# Patient Record
Sex: Male | Born: 1995 | Race: White | Hispanic: No | Marital: Single | Smoking: Never smoker
Health system: Southern US, Community
[De-identification: ages and names within clinical notes are randomized; demographics above are authoritative.]

## PROBLEM LIST (undated history)

## (undated) HISTORY — PX: OTHER SURGICAL HISTORY: SHX169

## (undated) HISTORY — PX: KIDNEY SURGERY: SHX687

## (undated) HISTORY — PX: ANKLE SURGERY: SHX546

---
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
## 2013-06-18 ENCOUNTER — Encounter (INDEPENDENT_AMBULATORY_CARE_PROVIDER_SITE_OTHER): Payer: Self-pay | Admitting: General Surgery

## 2013-06-18 ENCOUNTER — Ambulatory Visit (INDEPENDENT_AMBULATORY_CARE_PROVIDER_SITE_OTHER): Payer: Managed Care, Other (non HMO) | Admitting: General Surgery

## 2013-06-18 VITALS — BP 124/78 | HR 84 | Temp 98.3°F | Resp 16 | Ht 75.0 in | Wt 201.0 lb

## 2013-06-18 DIAGNOSIS — K612 Anorectal abscess: Secondary | ICD-10-CM | POA: Insufficient documentation

## 2013-06-18 NOTE — Progress Notes (Signed)
Patient ID: Samuel Weaver, male   DOB: [birth year], 18 y.o.   MRN: [id number]  Chief Complaint  Patient presents with  . Follow-up    pilonidal cyst    HPI Samuel Weaver is a 18 y.o. male.   HPI  He is referred by Dr. Laurann Montanaynthia White for further evaluation of a pilonidal abscess. He noted some pain in his inferior gluteal cleft area about 9 days ago. He then began having spontaneous drainage from the site. He saw Dr. Ihor DowNnodi.  The wound was drained and he was started on oral antibiotics. He has been feeling much better since that time. He has never had anything like this before.  History reviewed. No pertinent past medical history.  Past Surgical History  Procedure Laterality Date  . Kidney surgery  1996    History reviewed. No pertinent family history.  Social History History  Substance Use Topics  . Smoking status: Never Smoker   . Smokeless tobacco: Never Used  . Alcohol Use: No    No Known Allergies  No current outpatient prescriptions on file.   No current facility-administered medications for this visit.    Review of Systems Review of Systems  All other systems reviewed and are negative.   Blood pressure 124/78, pulse 84, temperature 98.3 F (36.8 C), temperature source Temporal, resp. rate 16, height 6\' 3"  (1.905 m), weight 201 lb (91.173 kg).  Physical Exam Physical Exam  Constitutional: He appears well-developed and well-nourished. No distress.  Musculoskeletal:  Very tiny punctate wound just posterior to the anus. The gluteal cleft appears clean. No induration or cellulitis.  Neurological: He is alert.  Skin: Skin is warm.    Data Reviewed Note from Dr. Cliffton AstersWhite.  Assessment    Posterior anal rectal abscess versus pilonidal abscess. Almost completely resolved.     Plan    Clean the area with warm water daily for the next 4-5 days. Complete antibiotic course. Return if the infection comes back.        Jim Desanctisodd J Skyanne Welle 06/18/2013, 3:48  PM

## 2013-06-18 NOTE — Patient Instructions (Signed)
Clean area in the shower with warm water for the next 4-5 days. If the infection comes back, please call us and make an appointment. Take antibiotics until they are gone.

## 2015-09-08 ENCOUNTER — Emergency Department (HOSPITAL_COMMUNITY): Payer: Managed Care, Other (non HMO)

## 2015-09-08 ENCOUNTER — Encounter (HOSPITAL_COMMUNITY): Payer: Self-pay

## 2015-09-08 ENCOUNTER — Emergency Department (HOSPITAL_COMMUNITY)
Admission: EM | Admit: 2015-09-08 | Discharge: 2015-09-09 | Payer: Managed Care, Other (non HMO) | Attending: Emergency Medicine | Admitting: Emergency Medicine

## 2015-09-08 DIAGNOSIS — S32411A Displaced fracture of anterior wall of right acetabulum, initial encounter for closed fracture: Secondary | ICD-10-CM | POA: Diagnosis not present

## 2015-09-08 DIAGNOSIS — S42301A Unspecified fracture of shaft of humerus, right arm, initial encounter for closed fracture: Secondary | ICD-10-CM | POA: Insufficient documentation

## 2015-09-08 DIAGNOSIS — Y999 Unspecified external cause status: Secondary | ICD-10-CM | POA: Insufficient documentation

## 2015-09-08 DIAGNOSIS — S92141A Displaced dome fracture of right talus, initial encounter for closed fracture: Secondary | ICD-10-CM | POA: Diagnosis not present

## 2015-09-08 DIAGNOSIS — S72301A Unspecified fracture of shaft of right femur, initial encounter for closed fracture: Secondary | ICD-10-CM | POA: Diagnosis not present

## 2015-09-08 DIAGNOSIS — S0990XA Unspecified injury of head, initial encounter: Secondary | ICD-10-CM | POA: Diagnosis not present

## 2015-09-08 DIAGNOSIS — S299XXA Unspecified injury of thorax, initial encounter: Secondary | ICD-10-CM | POA: Diagnosis not present

## 2015-09-08 DIAGNOSIS — S7291XA Unspecified fracture of right femur, initial encounter for closed fracture: Secondary | ICD-10-CM

## 2015-09-08 DIAGNOSIS — S92101A Unspecified fracture of right talus, initial encounter for closed fracture: Secondary | ICD-10-CM

## 2015-09-08 DIAGNOSIS — Y92410 Unspecified street and highway as the place of occurrence of the external cause: Secondary | ICD-10-CM | POA: Diagnosis not present

## 2015-09-08 DIAGNOSIS — T1490XA Injury, unspecified, initial encounter: Secondary | ICD-10-CM

## 2015-09-08 DIAGNOSIS — Y939 Activity, unspecified: Secondary | ICD-10-CM | POA: Diagnosis not present

## 2015-09-08 DIAGNOSIS — S4991XA Unspecified injury of right shoulder and upper arm, initial encounter: Secondary | ICD-10-CM | POA: Diagnosis present

## 2015-09-08 LAB — I-STAT CHEM 8, ED
BUN: 18 mg/dL (ref 6–20)
CHLORIDE: 105 mmol/L (ref 101–111)
CREATININE: 1 mg/dL (ref 0.61–1.24)
Calcium, Ion: 1 mmol/L — ABNORMAL LOW (ref 1.13–1.30)
GLUCOSE: 120 mg/dL — AB (ref 65–99)
HEMATOCRIT: 47 % (ref 39.0–52.0)
Hemoglobin: 16 g/dL (ref 13.0–17.0)
POTASSIUM: 3.7 mmol/L (ref 3.5–5.1)
Sodium: 140 mmol/L (ref 135–145)
TCO2: 21 mmol/L (ref 0–100)

## 2015-09-08 LAB — COMPREHENSIVE METABOLIC PANEL
ALK PHOS: 97 U/L (ref 38–126)
ALT: 29 U/L (ref 17–63)
AST: 42 U/L — AB (ref 15–41)
Albumin: 3.8 g/dL (ref 3.5–5.0)
Anion gap: 10 (ref 5–15)
BILIRUBIN TOTAL: 0.8 mg/dL (ref 0.3–1.2)
BUN: 15 mg/dL (ref 6–20)
CHLORIDE: 107 mmol/L (ref 101–111)
CO2: 22 mmol/L (ref 22–32)
CREATININE: 1 mg/dL (ref 0.61–1.24)
Calcium: 8.8 mg/dL — ABNORMAL LOW (ref 8.9–10.3)
Glucose, Bld: 117 mg/dL — ABNORMAL HIGH (ref 65–99)
Potassium: 3.7 mmol/L (ref 3.5–5.1)
Sodium: 139 mmol/L (ref 135–145)
Total Protein: 6.8 g/dL (ref 6.5–8.1)

## 2015-09-08 LAB — I-STAT CG4 LACTIC ACID, ED: Lactic Acid, Venous: 2.89 mmol/L (ref 0.5–1.9)

## 2015-09-08 LAB — CBC
HEMATOCRIT: 46.8 % (ref 39.0–52.0)
Hemoglobin: 16.1 g/dL (ref 13.0–17.0)
MCH: 29.3 pg (ref 26.0–34.0)
MCHC: 34.4 g/dL (ref 30.0–36.0)
MCV: 85.1 fL (ref 78.0–100.0)
Platelets: 234 10*3/uL (ref 150–400)
RBC: 5.5 MIL/uL (ref 4.22–5.81)
RDW: 13 % (ref 11.5–15.5)
WBC: 12.8 10*3/uL — AB (ref 4.0–10.5)

## 2015-09-08 LAB — SAMPLE TO BLOOD BANK

## 2015-09-08 LAB — ETHANOL: Alcohol, Ethyl (B): <5 mg/dL (ref <5)

## 2015-09-08 LAB — PROTIME-INR
INR: 1.22 (ref 0.00–1.49)
PROTHROMBIN TIME: 15.5 s — AB (ref 11.6–15.2)

## 2015-09-08 LAB — CDS SEROLOGY

## 2015-09-08 MED ORDER — IOPAMIDOL (ISOVUE-300) INJECTION 61%
INTRAVENOUS | Status: AC
  Administered 2015-09-08: 100 mL
  Filled 2015-09-08: qty 100

## 2015-09-08 MED ORDER — HYDROMORPHONE HCL 1 MG/ML IJ SOLN
1.0000 mg | Freq: Once | INTRAMUSCULAR | Status: AC
  Administered 2015-09-08: 1 mg via INTRAVENOUS
  Filled 2015-09-08: qty 1

## 2015-09-08 MED ORDER — HYDROMORPHONE HCL 1 MG/ML IJ SOLN
INTRAMUSCULAR | Status: AC | PRN
  Administered 2015-09-08: 1 mg via INTRAVENOUS

## 2015-09-08 MED ORDER — SODIUM CHLORIDE 0.9 % IV BOLUS (SEPSIS)
1000.0000 mL | Freq: Once | INTRAVENOUS | Status: AC
  Administered 2015-09-08: 1000 mL via INTRAVENOUS

## 2015-09-08 MED ORDER — HYDROMORPHONE HCL 1 MG/ML IJ SOLN
INTRAMUSCULAR | Status: AC
  Filled 2015-09-08: qty 1

## 2015-09-08 MED ORDER — ONDANSETRON HCL 4 MG/2ML IJ SOLN
INTRAMUSCULAR | Status: AC
  Administered 2015-09-08: 21:00:00
  Filled 2015-09-08: qty 2
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
## 2015-09-08 NOTE — ED Notes (Signed)
Pts father Sharlot Gowda(Gregg) called and informed that patient is here per pts request

## 2015-09-08 NOTE — Progress Notes (Signed)
Orthopedic Tech Progress Note Patient Details:  Samuel Weaver Sep 05, 1995 [id number]  Ortho Devices Type of Ortho Device: Ace wrap, Knee Immobilizer, Stirrup splint, Coapt Ortho Device/Splint Location: ki RLE, coapt RUE Ortho Device/Splint Interventions: Ordered, Application   Jennye MoccasinHughes, Sascha Palma Craig 09/08/2015, 9:46 PM

## 2015-09-08 NOTE — ED Notes (Signed)
Family at beside. Family given emotional support. 

## 2015-09-08 NOTE — ED Notes (Signed)
Pt placed on 2 L o2.

## 2015-09-08 NOTE — ED Notes (Addendum)
Contacted radiology at request of Dr. Preston FleetingGlick to prepare pt's xrays to move to [facility]

## 2015-09-09 ENCOUNTER — Encounter (INDEPENDENT_AMBULATORY_CARE_PROVIDER_SITE_OTHER): Payer: Self-pay | Admitting: General Surgery

## 2015-09-10 MED FILL — Hydromorphone HCl Inj 2 MG/ML: INTRAMUSCULAR | Qty: 1 | Status: AC

## 2015-10-11 ENCOUNTER — Encounter (HOSPITAL_BASED_OUTPATIENT_CLINIC_OR_DEPARTMENT_OTHER): Payer: Self-pay | Admitting: Emergency Medicine

## 2015-10-11 ENCOUNTER — Emergency Department (HOSPITAL_BASED_OUTPATIENT_CLINIC_OR_DEPARTMENT_OTHER)
Admission: EM | Admit: 2015-10-11 | Discharge: 2015-10-12 | Disposition: A | Discharge status: Home / Self Care | Payer: Managed Care, Other (non HMO) | Attending: Emergency Medicine | Admitting: Emergency Medicine

## 2015-10-11 ENCOUNTER — Emergency Department (HOSPITAL_BASED_OUTPATIENT_CLINIC_OR_DEPARTMENT_OTHER): Payer: Managed Care, Other (non HMO)

## 2015-10-11 DIAGNOSIS — Z79899 Other long term (current) drug therapy: Secondary | ICD-10-CM | POA: Insufficient documentation

## 2015-10-11 DIAGNOSIS — N201 Calculus of ureter: Secondary | ICD-10-CM | POA: Diagnosis not present

## 2015-10-11 DIAGNOSIS — R109 Unspecified abdominal pain: Secondary | ICD-10-CM | POA: Diagnosis present

## 2015-10-11 LAB — CBC WITH DIFFERENTIAL/PLATELET
BASOS ABS: 0 10*3/uL (ref 0.0–0.1)
BASOS PCT: 1 %
EOS ABS: 0.2 10*3/uL (ref 0.0–0.7)
EOS PCT: 3 %
HEMATOCRIT: 35.9 % — AB (ref 39.0–52.0)
Hemoglobin: 11.3 g/dL — ABNORMAL LOW (ref 13.0–17.0)
Lymphocytes Relative: 26 %
Lymphs Abs: 1.7 10*3/uL (ref 0.7–4.0)
MCH: 26.4 pg (ref 26.0–34.0)
MCHC: 31.5 g/dL (ref 30.0–36.0)
MCV: 83.9 fL (ref 78.0–100.0)
MONO ABS: 0.7 10*3/uL (ref 0.1–1.0)
MONOS PCT: 10 %
Neutro Abs: 4 10*3/uL (ref 1.7–7.7)
Neutrophils Relative %: 60 %
PLATELETS: 259 10*3/uL (ref 150–400)
RBC: 4.28 MIL/uL (ref 4.22–5.81)
RDW: 14.2 % (ref 11.5–15.5)
WBC: 6.7 10*3/uL (ref 4.0–10.5)

## 2015-10-11 LAB — BASIC METABOLIC PANEL
Anion gap: 9 (ref 5–15)
BUN: 16 mg/dL (ref 6–20)
CALCIUM: 8.7 mg/dL — AB (ref 8.9–10.3)
CO2: 27 mmol/L (ref 22–32)
CREATININE: 0.98 mg/dL (ref 0.61–1.24)
Chloride: 101 mmol/L (ref 101–111)
Glucose, Bld: 105 mg/dL — ABNORMAL HIGH (ref 65–99)
Potassium: 3.6 mmol/L (ref 3.5–5.1)
SODIUM: 137 mmol/L (ref 135–145)

## 2015-10-11 LAB — URINALYSIS, ROUTINE W REFLEX MICROSCOPIC
Bilirubin Urine: NEGATIVE
GLUCOSE, UA: NEGATIVE mg/dL
Hgb urine dipstick: NEGATIVE
Ketones, ur: NEGATIVE mg/dL
LEUKOCYTES UA: NEGATIVE
NITRITE: NEGATIVE
PROTEIN: NEGATIVE mg/dL
Specific Gravity, Urine: 1.024 (ref 1.005–1.030)
pH: 6.5 (ref 5.0–8.0)

## 2015-10-11 MED ORDER — TAMSULOSIN HCL 0.4 MG PO CAPS: 0.4000 mg | ORAL_CAPSULE | Freq: Every day | ORAL | 0 refills | Status: AC

## 2015-10-11 MED ORDER — HYDROMORPHONE HCL 1 MG/ML IJ SOLN
0.5000 mg | Freq: Once | INTRAMUSCULAR | Status: AC
  Administered 2015-10-11: 0.5 mg via INTRAVENOUS
  Filled 2015-10-11: qty 1

## 2015-10-11 MED ORDER — ONDANSETRON HCL 4 MG/2ML IJ SOLN
4.0000 mg | Freq: Once | INTRAMUSCULAR | Status: AC
  Administered 2015-10-11: 4 mg via INTRAVENOUS

## 2015-10-11 MED ORDER — SODIUM CHLORIDE 0.9 % IV BOLUS (SEPSIS)
1000.0000 mL | Freq: Once | INTRAVENOUS | Status: AC
  Administered 2015-10-11: 1000 mL via INTRAVENOUS

## 2015-10-11 MED ORDER — ONDANSETRON HCL 4 MG/2ML IJ SOLN
INTRAMUSCULAR | Status: AC
  Administered 2015-10-11: 4 mg via INTRAVENOUS
  Filled 2015-10-11: qty 2

## 2015-10-11 MED ORDER — HYDROMORPHONE HCL 1 MG/ML IJ SOLN
1.0000 mg | Freq: Once | INTRAMUSCULAR | Status: AC
  Administered 2015-10-11: 1 mg via INTRAVENOUS
  Filled 2015-10-11: qty 1

## 2015-10-11 MED ORDER — ONDANSETRON HCL 4 MG PO TABS: 4.0000 mg | ORAL_TABLET | Freq: Four times a day (QID) | ORAL | 0 refills | Status: AC | PRN

## 2015-10-11 MED ORDER — ONDANSETRON HCL 4 MG/2ML IJ SOLN
4.0000 mg | Freq: Once | INTRAMUSCULAR | Status: AC
Start: 2015-10-11 — End: 2015-10-11
  Administered 2015-10-11: 4 mg via INTRAVENOUS
  Filled 2015-10-11: qty 2

## 2015-10-11 NOTE — Discharge Instructions (Signed)
You may have small stone on the left side. Continue pain medications, flomax, and nausea medications as needed. You are given urology follow-up. Please see PCP for further pain management as well.  Return for worsening symptoms, including intractable vomiting, fever, escalating pain, or any other symptoms concerning to you.

## 2015-10-11 NOTE — ED Notes (Signed)
Pt attempted to give urine sample without success.
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
## 2015-10-12 NOTE — ED Notes (Signed)
Denies questions or needs, VSS, given Rx x2, out in personal w/c with parents and RN

## 2015-10-19 ENCOUNTER — Encounter: Payer: Self-pay | Admitting: Physical Therapy

## 2015-10-19 ENCOUNTER — Ambulatory Visit: Payer: Managed Care, Other (non HMO) | Attending: Physical Medicine and Rehabilitation | Admitting: Physical Therapy

## 2015-10-19 DIAGNOSIS — M25671 Stiffness of right ankle, not elsewhere classified: Secondary | ICD-10-CM | POA: Insufficient documentation

## 2015-10-19 DIAGNOSIS — M25571 Pain in right ankle and joints of right foot: Secondary | ICD-10-CM | POA: Diagnosis present

## 2015-10-19 DIAGNOSIS — M6281 Muscle weakness (generalized): Secondary | ICD-10-CM | POA: Diagnosis present

## 2015-10-19 DIAGNOSIS — R262 Difficulty in walking, not elsewhere classified: Secondary | ICD-10-CM | POA: Diagnosis present
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
## 2015-10-20 ENCOUNTER — Ambulatory Visit: Payer: Managed Care, Other (non HMO) | Admitting: Physical Therapy

## 2015-10-20 ENCOUNTER — Encounter: Payer: Self-pay | Admitting: Physical Therapy

## 2015-10-20 DIAGNOSIS — R262 Difficulty in walking, not elsewhere classified: Secondary | ICD-10-CM | POA: Diagnosis not present

## 2015-10-20 DIAGNOSIS — M25671 Stiffness of right ankle, not elsewhere classified: Secondary | ICD-10-CM

## 2015-10-20 DIAGNOSIS — M6281 Muscle weakness (generalized): Secondary | ICD-10-CM

## 2015-10-20 DIAGNOSIS — M25571 Pain in right ankle and joints of right foot: Secondary | ICD-10-CM
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
## 2015-10-23 ENCOUNTER — Ambulatory Visit: Payer: Managed Care, Other (non HMO) | Admitting: Physical Therapy

## 2015-10-23 ENCOUNTER — Encounter: Payer: Self-pay | Admitting: Physical Therapy

## 2015-10-23 DIAGNOSIS — R262 Difficulty in walking, not elsewhere classified: Secondary | ICD-10-CM

## 2015-10-23 DIAGNOSIS — M25671 Stiffness of right ankle, not elsewhere classified: Secondary | ICD-10-CM

## 2015-10-23 DIAGNOSIS — M25571 Pain in right ankle and joints of right foot: Secondary | ICD-10-CM

## 2015-10-23 DIAGNOSIS — M6281 Muscle weakness (generalized): Secondary | ICD-10-CM
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
## 2015-10-26 ENCOUNTER — Encounter: Payer: Self-pay | Admitting: Physical Therapy

## 2015-10-26 ENCOUNTER — Ambulatory Visit: Payer: Managed Care, Other (non HMO) | Admitting: Physical Therapy

## 2015-10-26 DIAGNOSIS — M6281 Muscle weakness (generalized): Secondary | ICD-10-CM

## 2015-10-26 DIAGNOSIS — R262 Difficulty in walking, not elsewhere classified: Secondary | ICD-10-CM | POA: Diagnosis not present

## 2015-10-26 DIAGNOSIS — M25671 Stiffness of right ankle, not elsewhere classified: Secondary | ICD-10-CM

## 2015-10-26 DIAGNOSIS — M25571 Pain in right ankle and joints of right foot: Secondary | ICD-10-CM
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
## 2015-10-28 ENCOUNTER — Encounter: Payer: Self-pay | Admitting: Physical Therapy

## 2015-10-28 ENCOUNTER — Ambulatory Visit: Payer: Managed Care, Other (non HMO) | Admitting: Physical Therapy

## 2015-10-28 DIAGNOSIS — M25571 Pain in right ankle and joints of right foot: Secondary | ICD-10-CM

## 2015-10-28 DIAGNOSIS — R262 Difficulty in walking, not elsewhere classified: Secondary | ICD-10-CM | POA: Diagnosis not present

## 2015-10-28 DIAGNOSIS — M25671 Stiffness of right ankle, not elsewhere classified: Secondary | ICD-10-CM

## 2015-10-28 DIAGNOSIS — M6281 Muscle weakness (generalized): Secondary | ICD-10-CM
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
## 2015-10-30 ENCOUNTER — Encounter: Payer: Self-pay | Admitting: Physical Therapy

## 2015-10-30 ENCOUNTER — Ambulatory Visit: Payer: Managed Care, Other (non HMO) | Admitting: Physical Therapy

## 2015-10-30 DIAGNOSIS — M25671 Stiffness of right ankle, not elsewhere classified: Secondary | ICD-10-CM

## 2015-10-30 DIAGNOSIS — M6281 Muscle weakness (generalized): Secondary | ICD-10-CM

## 2015-10-30 DIAGNOSIS — R262 Difficulty in walking, not elsewhere classified: Secondary | ICD-10-CM | POA: Diagnosis not present

## 2015-10-30 DIAGNOSIS — M25571 Pain in right ankle and joints of right foot: Secondary | ICD-10-CM
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
## 2015-11-02 ENCOUNTER — Ambulatory Visit: Payer: Managed Care, Other (non HMO) | Admitting: Physical Therapy

## 2015-11-02 ENCOUNTER — Encounter: Payer: Self-pay | Admitting: Physical Therapy

## 2015-11-02 DIAGNOSIS — M25571 Pain in right ankle and joints of right foot: Secondary | ICD-10-CM

## 2015-11-02 DIAGNOSIS — M25671 Stiffness of right ankle, not elsewhere classified: Secondary | ICD-10-CM

## 2015-11-02 DIAGNOSIS — R262 Difficulty in walking, not elsewhere classified: Secondary | ICD-10-CM

## 2015-11-02 DIAGNOSIS — M6281 Muscle weakness (generalized): Secondary | ICD-10-CM
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
## 2015-11-04 ENCOUNTER — Ambulatory Visit: Payer: Managed Care, Other (non HMO) | Admitting: Physical Therapy

## 2015-11-04 ENCOUNTER — Encounter: Payer: Self-pay | Admitting: Physical Therapy

## 2015-11-04 DIAGNOSIS — R262 Difficulty in walking, not elsewhere classified: Secondary | ICD-10-CM | POA: Diagnosis not present

## 2015-11-04 DIAGNOSIS — M6281 Muscle weakness (generalized): Secondary | ICD-10-CM

## 2015-11-04 DIAGNOSIS — M25671 Stiffness of right ankle, not elsewhere classified: Secondary | ICD-10-CM

## 2015-11-04 DIAGNOSIS — M25571 Pain in right ankle and joints of right foot: Secondary | ICD-10-CM
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
## 2015-11-11 ENCOUNTER — Ambulatory Visit: Payer: Managed Care, Other (non HMO) | Attending: Physical Medicine and Rehabilitation | Admitting: Physical Therapy

## 2015-11-11 ENCOUNTER — Encounter: Payer: Self-pay | Admitting: Physical Therapy

## 2015-11-11 DIAGNOSIS — M25621 Stiffness of right elbow, not elsewhere classified: Secondary | ICD-10-CM | POA: Insufficient documentation

## 2015-11-11 DIAGNOSIS — M6281 Muscle weakness (generalized): Secondary | ICD-10-CM | POA: Insufficient documentation

## 2015-11-11 DIAGNOSIS — M25611 Stiffness of right shoulder, not elsewhere classified: Secondary | ICD-10-CM | POA: Insufficient documentation

## 2015-11-11 DIAGNOSIS — M25571 Pain in right ankle and joints of right foot: Secondary | ICD-10-CM | POA: Diagnosis present

## 2015-11-11 DIAGNOSIS — R29898 Other symptoms and signs involving the musculoskeletal system: Secondary | ICD-10-CM | POA: Diagnosis present

## 2015-11-11 DIAGNOSIS — R278 Other lack of coordination: Secondary | ICD-10-CM | POA: Insufficient documentation

## 2015-11-11 DIAGNOSIS — R29818 Other symptoms and signs involving the nervous system: Secondary | ICD-10-CM | POA: Diagnosis present

## 2015-11-11 DIAGNOSIS — R208 Other disturbances of skin sensation: Secondary | ICD-10-CM | POA: Insufficient documentation

## 2015-11-11 DIAGNOSIS — R262 Difficulty in walking, not elsewhere classified: Secondary | ICD-10-CM | POA: Diagnosis present

## 2015-11-11 DIAGNOSIS — M25671 Stiffness of right ankle, not elsewhere classified: Secondary | ICD-10-CM | POA: Insufficient documentation
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
## 2015-11-13 ENCOUNTER — Encounter: Payer: Self-pay | Admitting: Physical Therapy

## 2015-11-13 ENCOUNTER — Ambulatory Visit: Payer: Managed Care, Other (non HMO) | Admitting: Physical Therapy

## 2015-11-13 DIAGNOSIS — M25671 Stiffness of right ankle, not elsewhere classified: Secondary | ICD-10-CM | POA: Diagnosis not present

## 2015-11-13 DIAGNOSIS — R262 Difficulty in walking, not elsewhere classified: Secondary | ICD-10-CM

## 2015-11-13 DIAGNOSIS — M25571 Pain in right ankle and joints of right foot: Secondary | ICD-10-CM

## 2015-11-13 DIAGNOSIS — M6281 Muscle weakness (generalized): Secondary | ICD-10-CM
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
## 2015-11-16 ENCOUNTER — Ambulatory Visit: Payer: Managed Care, Other (non HMO) | Admitting: Physical Therapy

## 2015-11-17 ENCOUNTER — Ambulatory Visit: Payer: Managed Care, Other (non HMO) | Admitting: Physical Therapy

## 2015-11-17 ENCOUNTER — Encounter: Payer: Self-pay | Admitting: Physical Therapy

## 2015-11-17 DIAGNOSIS — M25671 Stiffness of right ankle, not elsewhere classified: Secondary | ICD-10-CM | POA: Diagnosis not present

## 2015-11-17 DIAGNOSIS — M25571 Pain in right ankle and joints of right foot: Secondary | ICD-10-CM

## 2015-11-17 DIAGNOSIS — R262 Difficulty in walking, not elsewhere classified: Secondary | ICD-10-CM

## 2015-11-17 DIAGNOSIS — M6281 Muscle weakness (generalized): Secondary | ICD-10-CM
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
## 2015-11-18 ENCOUNTER — Ambulatory Visit: Payer: Managed Care, Other (non HMO) | Admitting: Occupational Therapy

## 2015-11-18 ENCOUNTER — Ambulatory Visit: Payer: Managed Care, Other (non HMO) | Admitting: Physical Therapy

## 2015-11-18 DIAGNOSIS — M25671 Stiffness of right ankle, not elsewhere classified: Secondary | ICD-10-CM | POA: Diagnosis not present

## 2015-11-18 DIAGNOSIS — R29818 Other symptoms and signs involving the nervous system: Secondary | ICD-10-CM

## 2015-11-18 DIAGNOSIS — R208 Other disturbances of skin sensation: Secondary | ICD-10-CM

## 2015-11-18 DIAGNOSIS — R29898 Other symptoms and signs involving the musculoskeletal system: Secondary | ICD-10-CM

## 2015-11-18 DIAGNOSIS — M25611 Stiffness of right shoulder, not elsewhere classified: Secondary | ICD-10-CM

## 2015-11-18 DIAGNOSIS — M6281 Muscle weakness (generalized): Secondary | ICD-10-CM

## 2015-11-18 DIAGNOSIS — M25621 Stiffness of right elbow, not elsewhere classified: Secondary | ICD-10-CM

## 2015-11-18 DIAGNOSIS — R278 Other lack of coordination: Secondary | ICD-10-CM
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
## 2015-11-20 ENCOUNTER — Ambulatory Visit: Payer: Managed Care, Other (non HMO) | Admitting: Physical Therapy

## 2015-11-20 ENCOUNTER — Encounter: Payer: Self-pay | Admitting: Physical Therapy

## 2015-11-20 DIAGNOSIS — M6281 Muscle weakness (generalized): Secondary | ICD-10-CM

## 2015-11-20 DIAGNOSIS — M25671 Stiffness of right ankle, not elsewhere classified: Secondary | ICD-10-CM | POA: Diagnosis not present
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
## 2015-11-23 ENCOUNTER — Ambulatory Visit: Payer: Managed Care, Other (non HMO) | Admitting: Occupational Therapy

## 2015-11-23 DIAGNOSIS — M6281 Muscle weakness (generalized): Secondary | ICD-10-CM

## 2015-11-23 DIAGNOSIS — R29818 Other symptoms and signs involving the nervous system: Secondary | ICD-10-CM

## 2015-11-23 DIAGNOSIS — M25671 Stiffness of right ankle, not elsewhere classified: Secondary | ICD-10-CM | POA: Diagnosis not present

## 2015-11-23 DIAGNOSIS — R208 Other disturbances of skin sensation: Secondary | ICD-10-CM

## 2015-11-23 DIAGNOSIS — R29898 Other symptoms and signs involving the musculoskeletal system: Secondary | ICD-10-CM
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
## 2015-11-23 NOTE — Patient Instructions (Signed)
1. Extension (Assistive)    Arm on table with thumb-up. Bend hand back at wrist. Hold __5__ seconds. Alternate way: Use other hand to bring hand up, then let go and try and hold. Repeat _10___ times. Do __6__ sessions per day.    2. Finger extension - keep wrist supported in neutral position with wrist splint on, and push cards out with focus on straightening big knuckles, especially at long and ring finger. Go through entire deck x 5 reps, 4-6 times per day   3. 1. Grip Strengthening (Resistive Putty)   Squeeze putty using thumb and all fingers. Repeat _15__ times. Do __3_ sessions per day.

## 2015-11-25 ENCOUNTER — Encounter: Payer: Managed Care, Other (non HMO) | Admitting: Occupational Therapy

## 2015-11-27 ENCOUNTER — Ambulatory Visit: Payer: Managed Care, Other (non HMO) | Admitting: Physical Therapy

## 2015-11-27 ENCOUNTER — Encounter: Payer: Self-pay | Admitting: Physical Therapy

## 2015-11-27 DIAGNOSIS — M25671 Stiffness of right ankle, not elsewhere classified: Secondary | ICD-10-CM

## 2015-11-27 DIAGNOSIS — M6281 Muscle weakness (generalized): Secondary | ICD-10-CM
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
## 2015-11-30 ENCOUNTER — Ambulatory Visit: Payer: Managed Care, Other (non HMO) | Admitting: Occupational Therapy

## 2015-11-30 DIAGNOSIS — M25621 Stiffness of right elbow, not elsewhere classified: Secondary | ICD-10-CM

## 2015-11-30 DIAGNOSIS — M25671 Stiffness of right ankle, not elsewhere classified: Secondary | ICD-10-CM | POA: Diagnosis not present

## 2015-11-30 DIAGNOSIS — R29818 Other symptoms and signs involving the nervous system: Secondary | ICD-10-CM

## 2015-11-30 DIAGNOSIS — M25611 Stiffness of right shoulder, not elsewhere classified: Secondary | ICD-10-CM

## 2015-11-30 DIAGNOSIS — R29898 Other symptoms and signs involving the musculoskeletal system: Secondary | ICD-10-CM

## 2015-11-30 DIAGNOSIS — M6281 Muscle weakness (generalized): Secondary | ICD-10-CM

## 2015-11-30 NOTE — Patient Instructions (Signed)
Flexion (Resistive)    With 4-5 lb. Wrist cuff weight around Rt wrist,  bend elbow, keeping wrist straight. Support elbow with folded towel on table, or hold close to body. Hold _5___ seconds. Repeat __15__ times. Do _2___ sessions per day.   Leisurely Elbow Weight    Lie comfortably on back with a 4-5 lb cuff weight around Rt wrist. Bend arm so elbow points up and weight gently hangs. Keep shoulder flat on floor. Raise hand to straighten arm. Repeat __15__ times. Do __2__ sessions per day.    ROM: Flexion - Wand (Supine)    Lie on back holding wand with 5 lb. Cuff weight around dowel. Raise arms over head.  Repeat _15___ times per set.  Do _2___ sessions per day.   ROM: Abduction - Wand    Holding wand with 4-5 lb. Cuff weight around dowel, right hand palm up, push wand directly out to side, leading with other hand palm down, until stretch is felt. Hold __5__ seconds. Repeat _15___ times per set.  Do __2__ sessions per day.
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
## 2015-12-02 ENCOUNTER — Ambulatory Visit: Payer: Managed Care, Other (non HMO) | Admitting: Occupational Therapy

## 2015-12-02 DIAGNOSIS — R29898 Other symptoms and signs involving the musculoskeletal system: Secondary | ICD-10-CM

## 2015-12-02 DIAGNOSIS — R29818 Other symptoms and signs involving the nervous system: Secondary | ICD-10-CM

## 2015-12-02 DIAGNOSIS — M25671 Stiffness of right ankle, not elsewhere classified: Secondary | ICD-10-CM | POA: Diagnosis not present

## 2015-12-02 DIAGNOSIS — M6281 Muscle weakness (generalized): Secondary | ICD-10-CM

## 2015-12-02 DIAGNOSIS — M25621 Stiffness of right elbow, not elsewhere classified: Secondary | ICD-10-CM
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
## 2015-12-04 ENCOUNTER — Ambulatory Visit: Payer: Managed Care, Other (non HMO) | Admitting: Physical Therapy

## 2015-12-04 ENCOUNTER — Encounter: Payer: Self-pay | Admitting: Physical Therapy

## 2015-12-04 DIAGNOSIS — M25671 Stiffness of right ankle, not elsewhere classified: Secondary | ICD-10-CM | POA: Diagnosis not present

## 2015-12-04 DIAGNOSIS — M6281 Muscle weakness (generalized): Secondary | ICD-10-CM
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
## 2015-12-07 ENCOUNTER — Ambulatory Visit: Payer: Managed Care, Other (non HMO) | Attending: Orthopedic Surgery | Admitting: Occupational Therapy

## 2015-12-07 DIAGNOSIS — R29898 Other symptoms and signs involving the musculoskeletal system: Secondary | ICD-10-CM | POA: Insufficient documentation

## 2015-12-07 DIAGNOSIS — R262 Difficulty in walking, not elsewhere classified: Secondary | ICD-10-CM | POA: Insufficient documentation

## 2015-12-07 DIAGNOSIS — M25671 Stiffness of right ankle, not elsewhere classified: Secondary | ICD-10-CM | POA: Insufficient documentation

## 2015-12-07 DIAGNOSIS — M25621 Stiffness of right elbow, not elsewhere classified: Secondary | ICD-10-CM | POA: Diagnosis present

## 2015-12-07 DIAGNOSIS — M6281 Muscle weakness (generalized): Secondary | ICD-10-CM | POA: Diagnosis not present

## 2015-12-07 DIAGNOSIS — R29818 Other symptoms and signs involving the nervous system: Secondary | ICD-10-CM | POA: Diagnosis present
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
## 2015-12-09 ENCOUNTER — Ambulatory Visit: Payer: Managed Care, Other (non HMO) | Admitting: Occupational Therapy

## 2015-12-09 DIAGNOSIS — R29898 Other symptoms and signs involving the musculoskeletal system: Secondary | ICD-10-CM

## 2015-12-09 DIAGNOSIS — R29818 Other symptoms and signs involving the nervous system: Secondary | ICD-10-CM

## 2015-12-09 DIAGNOSIS — M6281 Muscle weakness (generalized): Secondary | ICD-10-CM

## 2015-12-09 DIAGNOSIS — M25621 Stiffness of right elbow, not elsewhere classified: Secondary | ICD-10-CM
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
## 2015-12-11 ENCOUNTER — Ambulatory Visit: Payer: Managed Care, Other (non HMO) | Admitting: Physical Therapy

## 2015-12-11 ENCOUNTER — Encounter: Payer: Self-pay | Admitting: Physical Therapy

## 2015-12-11 DIAGNOSIS — R29818 Other symptoms and signs involving the nervous system: Secondary | ICD-10-CM

## 2015-12-11 DIAGNOSIS — M6281 Muscle weakness (generalized): Secondary | ICD-10-CM

## 2015-12-11 DIAGNOSIS — R29898 Other symptoms and signs involving the musculoskeletal system: Secondary | ICD-10-CM
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
## 2015-12-14 ENCOUNTER — Ambulatory Visit: Payer: Managed Care, Other (non HMO) | Admitting: Occupational Therapy

## 2015-12-14 DIAGNOSIS — M6281 Muscle weakness (generalized): Secondary | ICD-10-CM | POA: Diagnosis not present

## 2015-12-14 DIAGNOSIS — R29898 Other symptoms and signs involving the musculoskeletal system: Secondary | ICD-10-CM

## 2015-12-14 DIAGNOSIS — R29818 Other symptoms and signs involving the nervous system: Secondary | ICD-10-CM
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
## 2015-12-18 ENCOUNTER — Ambulatory Visit: Payer: Managed Care, Other (non HMO) | Admitting: Physical Therapy

## 2015-12-18 ENCOUNTER — Encounter: Payer: Self-pay | Admitting: Physical Therapy

## 2015-12-18 DIAGNOSIS — M6281 Muscle weakness (generalized): Secondary | ICD-10-CM | POA: Diagnosis not present

## 2015-12-18 DIAGNOSIS — R29898 Other symptoms and signs involving the musculoskeletal system: Secondary | ICD-10-CM

## 2015-12-18 DIAGNOSIS — R29818 Other symptoms and signs involving the nervous system: Secondary | ICD-10-CM
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
## 2015-12-21 ENCOUNTER — Ambulatory Visit: Payer: Managed Care, Other (non HMO) | Admitting: Occupational Therapy

## 2015-12-21 DIAGNOSIS — M6281 Muscle weakness (generalized): Secondary | ICD-10-CM

## 2015-12-21 DIAGNOSIS — R29818 Other symptoms and signs involving the nervous system: Secondary | ICD-10-CM

## 2015-12-21 DIAGNOSIS — M25621 Stiffness of right elbow, not elsewhere classified: Secondary | ICD-10-CM

## 2015-12-21 DIAGNOSIS — R29898 Other symptoms and signs involving the musculoskeletal system: Secondary | ICD-10-CM
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
## 2015-12-23 ENCOUNTER — Ambulatory Visit: Payer: Managed Care, Other (non HMO) | Admitting: Occupational Therapy

## 2015-12-23 DIAGNOSIS — M6281 Muscle weakness (generalized): Secondary | ICD-10-CM

## 2015-12-23 DIAGNOSIS — R29898 Other symptoms and signs involving the musculoskeletal system: Secondary | ICD-10-CM

## 2015-12-23 DIAGNOSIS — R29818 Other symptoms and signs involving the nervous system: Secondary | ICD-10-CM
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
## 2015-12-24 ENCOUNTER — Encounter: Payer: Self-pay | Admitting: Physical Therapy

## 2015-12-24 ENCOUNTER — Other Ambulatory Visit: Payer: Self-pay | Admitting: Urology

## 2015-12-24 ENCOUNTER — Ambulatory Visit: Payer: Managed Care, Other (non HMO) | Admitting: Physical Therapy

## 2015-12-24 DIAGNOSIS — M6281 Muscle weakness (generalized): Secondary | ICD-10-CM | POA: Diagnosis not present

## 2015-12-24 DIAGNOSIS — R262 Difficulty in walking, not elsewhere classified: Secondary | ICD-10-CM

## 2015-12-24 DIAGNOSIS — R29898 Other symptoms and signs involving the musculoskeletal system: Secondary | ICD-10-CM

## 2015-12-24 DIAGNOSIS — N1330 Unspecified hydronephrosis: Secondary | ICD-10-CM

## 2015-12-24 DIAGNOSIS — R29818 Other symptoms and signs involving the nervous system: Secondary | ICD-10-CM

## 2015-12-24 DIAGNOSIS — M25671 Stiffness of right ankle, not elsewhere classified: Secondary | ICD-10-CM

## 2015-12-24 NOTE — Addendum Note (Signed)
Addended by: Jearld LeschALBRIGHT, Joell Usman W on: 12/24/2015 10:34 AM   Modules accepted: Orders
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
## 2015-12-25 ENCOUNTER — Ambulatory Visit: Payer: Managed Care, Other (non HMO) | Admitting: Physical Therapy

## 2015-12-30 ENCOUNTER — Ambulatory Visit: Payer: Managed Care, Other (non HMO) | Admitting: Occupational Therapy

## 2015-12-30 ENCOUNTER — Encounter: Payer: Self-pay | Admitting: Physical Therapy

## 2015-12-30 ENCOUNTER — Ambulatory Visit: Payer: Managed Care, Other (non HMO) | Admitting: Physical Therapy

## 2015-12-30 DIAGNOSIS — M25671 Stiffness of right ankle, not elsewhere classified: Secondary | ICD-10-CM

## 2015-12-30 DIAGNOSIS — M6281 Muscle weakness (generalized): Secondary | ICD-10-CM

## 2015-12-30 DIAGNOSIS — R29818 Other symptoms and signs involving the nervous system: Secondary | ICD-10-CM

## 2015-12-30 DIAGNOSIS — R29898 Other symptoms and signs involving the musculoskeletal system: Secondary | ICD-10-CM

## 2015-12-30 DIAGNOSIS — R262 Difficulty in walking, not elsewhere classified: Secondary | ICD-10-CM
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
## 2016-01-01 ENCOUNTER — Ambulatory Visit: Payer: Managed Care, Other (non HMO) | Admitting: Physical Therapy

## 2016-01-01 DIAGNOSIS — R29818 Other symptoms and signs involving the nervous system: Secondary | ICD-10-CM

## 2016-01-01 DIAGNOSIS — M6281 Muscle weakness (generalized): Secondary | ICD-10-CM | POA: Diagnosis not present

## 2016-01-01 DIAGNOSIS — R262 Difficulty in walking, not elsewhere classified: Secondary | ICD-10-CM

## 2016-01-01 DIAGNOSIS — M25671 Stiffness of right ankle, not elsewhere classified: Secondary | ICD-10-CM

## 2016-01-01 DIAGNOSIS — R29898 Other symptoms and signs involving the musculoskeletal system: Secondary | ICD-10-CM
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
## 2016-01-04 ENCOUNTER — Ambulatory Visit: Payer: Managed Care, Other (non HMO) | Admitting: Occupational Therapy

## 2016-01-04 DIAGNOSIS — M6281 Muscle weakness (generalized): Secondary | ICD-10-CM | POA: Diagnosis not present

## 2016-01-04 DIAGNOSIS — R29898 Other symptoms and signs involving the musculoskeletal system: Secondary | ICD-10-CM

## 2016-01-04 DIAGNOSIS — R29818 Other symptoms and signs involving the nervous system: Secondary | ICD-10-CM
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
## 2016-01-05 ENCOUNTER — Encounter: Payer: Self-pay | Admitting: Physical Therapy

## 2016-01-05 ENCOUNTER — Ambulatory Visit: Payer: Managed Care, Other (non HMO) | Admitting: Physical Therapy

## 2016-01-05 DIAGNOSIS — M6281 Muscle weakness (generalized): Secondary | ICD-10-CM | POA: Diagnosis not present

## 2016-01-05 DIAGNOSIS — M25671 Stiffness of right ankle, not elsewhere classified: Secondary | ICD-10-CM

## 2016-01-05 DIAGNOSIS — R262 Difficulty in walking, not elsewhere classified: Secondary | ICD-10-CM
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
## 2016-01-06 ENCOUNTER — Ambulatory Visit: Payer: Managed Care, Other (non HMO) | Attending: Physical Medicine and Rehabilitation | Admitting: Occupational Therapy

## 2016-01-06 DIAGNOSIS — R29898 Other symptoms and signs involving the musculoskeletal system: Secondary | ICD-10-CM | POA: Diagnosis present

## 2016-01-06 DIAGNOSIS — M25671 Stiffness of right ankle, not elsewhere classified: Secondary | ICD-10-CM | POA: Insufficient documentation

## 2016-01-06 DIAGNOSIS — R262 Difficulty in walking, not elsewhere classified: Secondary | ICD-10-CM | POA: Insufficient documentation

## 2016-01-06 DIAGNOSIS — M25621 Stiffness of right elbow, not elsewhere classified: Secondary | ICD-10-CM

## 2016-01-06 DIAGNOSIS — R29818 Other symptoms and signs involving the nervous system: Secondary | ICD-10-CM | POA: Insufficient documentation

## 2016-01-06 DIAGNOSIS — M6281 Muscle weakness (generalized): Secondary | ICD-10-CM | POA: Insufficient documentation

## 2016-01-06 DIAGNOSIS — R278 Other lack of coordination: Secondary | ICD-10-CM | POA: Insufficient documentation
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
## 2016-01-08 ENCOUNTER — Encounter: Payer: Self-pay | Admitting: Physical Therapy

## 2016-01-08 ENCOUNTER — Ambulatory Visit: Payer: Managed Care, Other (non HMO) | Admitting: Physical Therapy

## 2016-01-08 DIAGNOSIS — R29818 Other symptoms and signs involving the nervous system: Secondary | ICD-10-CM

## 2016-01-08 DIAGNOSIS — R29898 Other symptoms and signs involving the musculoskeletal system: Secondary | ICD-10-CM

## 2016-01-08 DIAGNOSIS — R278 Other lack of coordination: Secondary | ICD-10-CM

## 2016-01-08 DIAGNOSIS — R262 Difficulty in walking, not elsewhere classified: Secondary | ICD-10-CM

## 2016-01-08 DIAGNOSIS — M25671 Stiffness of right ankle, not elsewhere classified: Secondary | ICD-10-CM

## 2016-01-08 DIAGNOSIS — M6281 Muscle weakness (generalized): Secondary | ICD-10-CM
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
## 2016-01-11 ENCOUNTER — Ambulatory Visit: Payer: Managed Care, Other (non HMO) | Admitting: Occupational Therapy

## 2016-01-11 DIAGNOSIS — M6281 Muscle weakness (generalized): Secondary | ICD-10-CM

## 2016-01-11 DIAGNOSIS — R29818 Other symptoms and signs involving the nervous system: Secondary | ICD-10-CM

## 2016-01-11 DIAGNOSIS — R278 Other lack of coordination: Secondary | ICD-10-CM

## 2016-01-11 DIAGNOSIS — R29898 Other symptoms and signs involving the musculoskeletal system: Secondary | ICD-10-CM
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
## 2016-01-12 ENCOUNTER — Ambulatory Visit: Payer: Managed Care, Other (non HMO) | Admitting: Physical Therapy

## 2016-01-12 ENCOUNTER — Encounter: Payer: Self-pay | Admitting: Physical Therapy

## 2016-01-12 DIAGNOSIS — R262 Difficulty in walking, not elsewhere classified: Secondary | ICD-10-CM

## 2016-01-12 DIAGNOSIS — R278 Other lack of coordination: Secondary | ICD-10-CM | POA: Diagnosis not present

## 2016-01-12 DIAGNOSIS — M25671 Stiffness of right ankle, not elsewhere classified: Secondary | ICD-10-CM

## 2016-01-12 DIAGNOSIS — M6281 Muscle weakness (generalized): Secondary | ICD-10-CM
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
## 2016-01-13 ENCOUNTER — Ambulatory Visit: Payer: Managed Care, Other (non HMO) | Admitting: Occupational Therapy

## 2016-01-13 DIAGNOSIS — R278 Other lack of coordination: Secondary | ICD-10-CM | POA: Diagnosis not present

## 2016-01-13 DIAGNOSIS — M6281 Muscle weakness (generalized): Secondary | ICD-10-CM
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
## 2016-01-13 NOTE — Patient Instructions (Signed)
  Coordination Activities  Perform the following activities for 15 minutes 2 times per day with right hand(s).   Toss ball in air and catch with the same hand.  Flip cards 1 at a time as fast as you can.  Deal cards with your thumb (Hold deck in hand and push card off top with thumb).  Pick up coins one at a time until you get 5-10 in your hand, then move coins from palm to fingertips to stack one at a time.  Practice writing and/or typing.  play piano

## 2016-01-15 ENCOUNTER — Ambulatory Visit: Payer: Managed Care, Other (non HMO) | Admitting: Physical Therapy

## 2016-01-15 ENCOUNTER — Encounter: Payer: Self-pay | Admitting: Physical Therapy

## 2016-01-15 DIAGNOSIS — R278 Other lack of coordination: Secondary | ICD-10-CM | POA: Diagnosis not present

## 2016-01-15 DIAGNOSIS — M25671 Stiffness of right ankle, not elsewhere classified: Secondary | ICD-10-CM

## 2016-01-15 DIAGNOSIS — M6281 Muscle weakness (generalized): Secondary | ICD-10-CM

## 2016-01-15 DIAGNOSIS — R262 Difficulty in walking, not elsewhere classified: Secondary | ICD-10-CM
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
## 2016-01-19 ENCOUNTER — Encounter: Payer: Self-pay | Admitting: Physical Therapy

## 2016-01-19 ENCOUNTER — Ambulatory Visit: Payer: Managed Care, Other (non HMO) | Admitting: Physical Therapy

## 2016-01-19 DIAGNOSIS — R278 Other lack of coordination: Secondary | ICD-10-CM | POA: Diagnosis not present

## 2016-01-19 DIAGNOSIS — R262 Difficulty in walking, not elsewhere classified: Secondary | ICD-10-CM

## 2016-01-19 DIAGNOSIS — M25671 Stiffness of right ankle, not elsewhere classified: Secondary | ICD-10-CM

## 2016-01-19 DIAGNOSIS — M6281 Muscle weakness (generalized): Secondary | ICD-10-CM
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
## 2016-01-20 ENCOUNTER — Ambulatory Visit: Payer: Managed Care, Other (non HMO) | Admitting: Occupational Therapy

## 2016-01-20 DIAGNOSIS — R278 Other lack of coordination: Secondary | ICD-10-CM

## 2016-01-20 DIAGNOSIS — M6281 Muscle weakness (generalized): Secondary | ICD-10-CM

## 2016-01-20 DIAGNOSIS — R29818 Other symptoms and signs involving the nervous system: Secondary | ICD-10-CM
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
## 2016-01-22 ENCOUNTER — Encounter: Payer: Self-pay | Admitting: Physical Therapy

## 2016-01-22 ENCOUNTER — Ambulatory Visit: Payer: Managed Care, Other (non HMO) | Admitting: Physical Therapy

## 2016-01-22 DIAGNOSIS — R278 Other lack of coordination: Secondary | ICD-10-CM | POA: Diagnosis not present

## 2016-01-22 DIAGNOSIS — R29818 Other symptoms and signs involving the nervous system: Secondary | ICD-10-CM

## 2016-01-22 DIAGNOSIS — M6281 Muscle weakness (generalized): Secondary | ICD-10-CM

## 2016-01-22 DIAGNOSIS — R262 Difficulty in walking, not elsewhere classified: Secondary | ICD-10-CM

## 2016-01-22 DIAGNOSIS — M25671 Stiffness of right ankle, not elsewhere classified: Secondary | ICD-10-CM
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
## 2016-01-25 ENCOUNTER — Ambulatory Visit: Payer: Managed Care, Other (non HMO) | Admitting: Occupational Therapy

## 2016-01-25 DIAGNOSIS — R278 Other lack of coordination: Secondary | ICD-10-CM

## 2016-01-25 DIAGNOSIS — M6281 Muscle weakness (generalized): Secondary | ICD-10-CM
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
## 2016-01-26 ENCOUNTER — Ambulatory Visit: Payer: Managed Care, Other (non HMO) | Admitting: Physical Therapy

## 2016-01-26 ENCOUNTER — Encounter (HOSPITAL_COMMUNITY)
Admission: RE | Admit: 2016-01-26 | Discharge: 2016-01-26 | Disposition: A | Discharge status: Home / Self Care | Payer: Managed Care, Other (non HMO) | Source: Ambulatory Visit | Attending: Urology | Admitting: Urology

## 2016-01-26 ENCOUNTER — Encounter: Payer: Self-pay | Admitting: Physical Therapy

## 2016-01-26 DIAGNOSIS — N1330 Unspecified hydronephrosis: Secondary | ICD-10-CM | POA: Insufficient documentation

## 2016-01-26 DIAGNOSIS — M6281 Muscle weakness (generalized): Secondary | ICD-10-CM

## 2016-01-26 DIAGNOSIS — R262 Difficulty in walking, not elsewhere classified: Secondary | ICD-10-CM

## 2016-01-26 DIAGNOSIS — R278 Other lack of coordination: Secondary | ICD-10-CM | POA: Diagnosis not present

## 2016-01-26 MED ORDER — FUROSEMIDE 10 MG/ML IJ SOLN
50.0000 mg | Freq: Once | INTRAMUSCULAR | Status: AC
  Administered 2016-01-26: 50 mg via INTRAVENOUS

## 2016-01-26 MED ORDER — TECHNETIUM TC 99M MERTIATIDE
5.0000 | Freq: Once | INTRAVENOUS | Status: AC | PRN
  Administered 2016-01-26: 5 via INTRAVENOUS

## 2016-01-26 MED ORDER — FUROSEMIDE 10 MG/ML IJ SOLN
INTRAMUSCULAR | Status: AC
  Filled 2016-01-26: qty 8
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
## 2016-02-01 ENCOUNTER — Ambulatory Visit: Payer: Managed Care, Other (non HMO) | Admitting: Occupational Therapy

## 2016-02-01 DIAGNOSIS — R29818 Other symptoms and signs involving the nervous system: Secondary | ICD-10-CM

## 2016-02-01 DIAGNOSIS — M6281 Muscle weakness (generalized): Secondary | ICD-10-CM

## 2016-02-01 DIAGNOSIS — R278 Other lack of coordination: Secondary | ICD-10-CM | POA: Diagnosis not present
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
## 2016-02-02 ENCOUNTER — Ambulatory Visit: Payer: Managed Care, Other (non HMO) | Admitting: Physical Therapy

## 2016-02-02 ENCOUNTER — Encounter: Payer: Self-pay | Admitting: Physical Therapy

## 2016-02-02 DIAGNOSIS — R262 Difficulty in walking, not elsewhere classified: Secondary | ICD-10-CM

## 2016-02-02 DIAGNOSIS — M6281 Muscle weakness (generalized): Secondary | ICD-10-CM

## 2016-02-02 DIAGNOSIS — R278 Other lack of coordination: Secondary | ICD-10-CM | POA: Diagnosis not present
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
## 2016-02-05 ENCOUNTER — Ambulatory Visit: Payer: Managed Care, Other (non HMO) | Admitting: Physical Therapy

## 2016-02-08 ENCOUNTER — Ambulatory Visit: Payer: Managed Care, Other (non HMO) | Attending: Physical Medicine and Rehabilitation | Admitting: Occupational Therapy

## 2016-02-08 DIAGNOSIS — M25671 Stiffness of right ankle, not elsewhere classified: Secondary | ICD-10-CM | POA: Insufficient documentation

## 2016-02-08 DIAGNOSIS — R278 Other lack of coordination: Secondary | ICD-10-CM

## 2016-02-08 DIAGNOSIS — M6281 Muscle weakness (generalized): Secondary | ICD-10-CM

## 2016-02-08 DIAGNOSIS — R262 Difficulty in walking, not elsewhere classified: Secondary | ICD-10-CM | POA: Insufficient documentation

## 2016-02-08 DIAGNOSIS — R29818 Other symptoms and signs involving the nervous system: Secondary | ICD-10-CM | POA: Insufficient documentation
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
## 2016-02-12 ENCOUNTER — Encounter: Payer: Self-pay | Admitting: Physical Therapy

## 2016-02-12 ENCOUNTER — Ambulatory Visit: Payer: Managed Care, Other (non HMO) | Admitting: Physical Therapy

## 2016-02-12 DIAGNOSIS — R262 Difficulty in walking, not elsewhere classified: Secondary | ICD-10-CM

## 2016-02-12 DIAGNOSIS — M25671 Stiffness of right ankle, not elsewhere classified: Secondary | ICD-10-CM

## 2016-02-12 DIAGNOSIS — M6281 Muscle weakness (generalized): Secondary | ICD-10-CM | POA: Diagnosis not present
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
## 2016-02-15 ENCOUNTER — Encounter: Payer: Managed Care, Other (non HMO) | Admitting: Occupational Therapy

## 2016-02-19 ENCOUNTER — Ambulatory Visit: Payer: Managed Care, Other (non HMO) | Admitting: Physical Therapy

## 2016-02-22 ENCOUNTER — Encounter: Payer: Managed Care, Other (non HMO) | Admitting: Occupational Therapy

## 2016-02-26 ENCOUNTER — Ambulatory Visit: Payer: Managed Care, Other (non HMO) | Admitting: Physical Therapy

## 2016-03-04 ENCOUNTER — Ambulatory Visit: Payer: Managed Care, Other (non HMO) | Admitting: Physical Therapy

## 2016-03-04 ENCOUNTER — Encounter: Payer: Self-pay | Admitting: Physical Therapy

## 2016-03-04 DIAGNOSIS — M6281 Muscle weakness (generalized): Secondary | ICD-10-CM

## 2016-03-04 DIAGNOSIS — R262 Difficulty in walking, not elsewhere classified: Secondary | ICD-10-CM

## 2016-03-04 DIAGNOSIS — M25671 Stiffness of right ankle, not elsewhere classified: Secondary | ICD-10-CM
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
## 2018-07-13 IMAGING — NM NM RENAL IMAGING FLOW W/ PHARM
2 series · 12 of 12 positions shown · non-contrast
Comparison: CT abdomen/pelvis 10/13/2015

CLINICAL DATA: Hydronephrosis, passed a kidney stone 1 month ago,
history of LEFT ureteral surgery at 8 6 months

EXAM:
NUCLEAR MEDICINE RENAL SCAN WITH DIURETIC ADMINISTRATION
TECHNIQUE: Radionuclide angiographic and sequential renal images were obtained
after intravenous injection of radiopharmaceutical. Imaging was
continued during slow intravenous injection of Lasix approximately
15 minutes after the start of the examination.
RADIOPHARMACEUTICALS:  5.0 mCi Wechnetium-RRm MAG3 IV
Pharmaceutical:  50 mg Lasix IV

[Series 1: renal scan · 4.14mm/px · 6 of 40 frames shown (1 of 2)]
[frame 4/40  full-range]
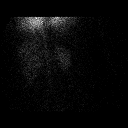
[frame 10/40  full-range]
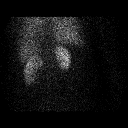
[frame 17/40  full-range]
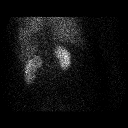
[frame 24/40  full-range]
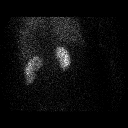
[frame 30/40  full-range]
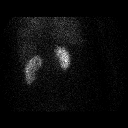
[frame 37/40  full-range]
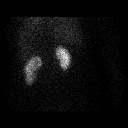

[Series 1: renal scan · 4.14mm/px · 6 of 90 frames shown (2 of 2)]
[frame 8/90]
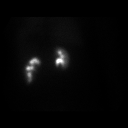
[frame 23/90]
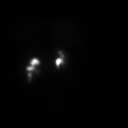
[frame 38/90]
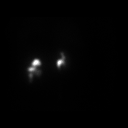
[frame 53/90]
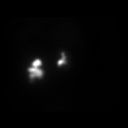
[frame 68/90]
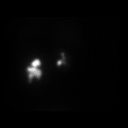
[frame 83/90]
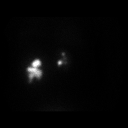

[12 of 12 positions shown; findings below may reference images not displayed]

FINDINGS: Flow:  Prompt symmetric arterial flow to the kidneys.

Left renogram: Images show prompt uptake and concentration of
tracer. Delayed excretion of tracer into a significantly dilated
LEFT renal collecting system. Poor washout of tracer prior to Lasix
administration. Increased clearance of tracer following diuresis.
Renogram curve demonstrates a gradually increasing curve with
delayed time to peak activity of 22.2 minutes followed by rapid
washout of tracer following diuretic administration.

Right renogram: Normal uptake and concentration tracer. Excretion of
tracer into RIGHT renal collecting system with a mildly prominent
RIGHT renal pelvis. Delayed time to peak activity of 18.2 minutes.
Rapid washout of tracer following diuretic administration.

Differential:

Left kidney = 47 %

Right kidney = 53 %

T1/2 post Lasix :

Left kidney = 15.9 min

Right kidney = 9.4 min
IMPRESSION: Dilated renal collecting systems bilaterally, mild on RIGHT and
significant on LEFT.

Both kidneys demonstrate rapid washout of tracer following diuretic
administration, indicative of dilatation without significant urinary
outflow obstruction.
